# Patient Record
Sex: Male | Born: 1965 | Race: White | Hispanic: Yes | Marital: Married | Smoking: Former smoker
Health system: Southern US, Community
[De-identification: ages and names within clinical notes are randomized; demographics above are authoritative.]

## PROBLEM LIST (undated history)

## (undated) DIAGNOSIS — I10 Essential (primary) hypertension: Secondary | ICD-10-CM

## (undated) DIAGNOSIS — E7800 Pure hypercholesterolemia, unspecified: Secondary | ICD-10-CM

## (undated) DIAGNOSIS — I4891 Unspecified atrial fibrillation: Secondary | ICD-10-CM

---

## 2019-09-27 ENCOUNTER — Emergency Department (HOSPITAL_COMMUNITY)
Admission: EM | Admit: 2019-09-27 | Discharge: 2019-09-27 | Disposition: A | Discharge status: Home / Self Care | Payer: Self-pay | Attending: Emergency Medicine | Admitting: Emergency Medicine

## 2019-09-27 ENCOUNTER — Other Ambulatory Visit: Payer: Self-pay

## 2019-09-27 ENCOUNTER — Encounter (HOSPITAL_COMMUNITY): Payer: Self-pay | Admitting: Emergency Medicine

## 2019-09-27 DIAGNOSIS — I10 Essential (primary) hypertension: Secondary | ICD-10-CM | POA: Insufficient documentation

## 2019-09-27 DIAGNOSIS — D692 Other nonthrombocytopenic purpura: Secondary | ICD-10-CM

## 2019-09-27 DIAGNOSIS — R233 Spontaneous ecchymoses: Secondary | ICD-10-CM | POA: Insufficient documentation

## 2019-09-27 HISTORY — DX: Unspecified atrial fibrillation: I48.91

## 2019-09-27 HISTORY — DX: Pure hypercholesterolemia, unspecified: E78.00

## 2019-09-27 HISTORY — DX: Essential (primary) hypertension: I10

## 2019-09-27 LAB — CBC
HCT: 44.1 % (ref 39.0–52.0)
Hemoglobin: 13.9 g/dL (ref 13.0–17.0)
MCH: 29 pg (ref 26.0–34.0)
MCHC: 31.5 g/dL (ref 30.0–36.0)
MCV: 92.1 fL (ref 80.0–100.0)
Platelets: 306 10*3/uL (ref 150–400)
RBC: 4.79 MIL/uL (ref 4.22–5.81)
RDW: 13.2 % (ref 11.5–15.5)
WBC: 8.3 10*3/uL (ref 4.0–10.5)
nRBC: 0 % (ref 0.0–0.2)

## 2019-09-27 LAB — BASIC METABOLIC PANEL
Anion gap: 8 (ref 5–15)
BUN: 15 mg/dL (ref 6–20)
CO2: 29 mmol/L (ref 22–32)
Calcium: 9.2 mg/dL (ref 8.9–10.3)
Chloride: 102 mmol/L (ref 98–111)
Creatinine, Ser: 0.88 mg/dL (ref 0.61–1.24)
GFR calc Af Amer: >60 mL/min (ref >60)
GFR calc non Af Amer: >60 mL/min (ref >60)
Glucose, Bld: 105 mg/dL — ABNORMAL HIGH (ref 70–99)
Potassium: 3.9 mmol/L (ref 3.5–5.1)
Sodium: 139 mmol/L (ref 135–145)

## 2019-09-27 LAB — PROTIME-INR
INR: 1.2 (ref 0.8–1.2)
Prothrombin Time: 14.6 seconds (ref 11.4–15.2)

## 2019-09-27 NOTE — Discharge Instructions (Addendum)
The rash should go away on it's own without any treatment If it continues to get worse and your have a fever or have any worsening symptoms you should return to the ER Please establish care with a cardiologist and primary doctor in the area

## 2019-09-27 NOTE — ED Triage Notes (Signed)
Patient reports "rash" to bilateral ankles X1 day. Swelling and redness noted to bilateral ankles, no rash present - almost appears to be petechiae? Patient takes Xarelto for afib.

## 2019-09-27 NOTE — ED Provider Notes (Signed)
[facility] EMERGENCY DEPARTMENT Provider Note   CSN: [id number] Arrival date & time: 09/27/19  0021     History Chief Complaint  Patient presents with  . Leg Swelling    Paul Harris is a 54 y.o. male who presents with a rash. He states that he thinks he was bit by an insect a couple days ago. Over the past 2 days he's had a gradually worsening rash on the bilateral lower extremities. It is red but he denies any symptoms. No itching, pain, swelling. He just moved her from [place] 2 weeks ago and does not have a PCP yet. He is on Xarelto for A.fib. He denies any fever, chills, weakness, chest pain, SOB, abodminal pain, N/V. He denies tick bite.  HPI     Past Medical History:  Diagnosis Date  . Atrial fibrillation (HCC)   . High cholesterol   . Hypertension     There are no problems to display for this patient.   History reviewed. No pertinent surgical history.     No family history on file.  Social History   Tobacco Use  . Smoking status: Never Smoker  . Smokeless tobacco: Never Used  Substance Use Topics  . Alcohol use: Not Currently  . Drug use: Not Currently    Home Medications Prior to Admission medications   Not on File    Allergies    Patient has no known allergies.  Review of Systems   Review of Systems  Constitutional: Negative for chills and fever.  Respiratory: Negative for shortness of breath.   Cardiovascular: Negative for chest pain.  Gastrointestinal: Negative for abdominal pain.  Musculoskeletal: Negative for arthralgias and myalgias.  Skin: Positive for color change and rash. Negative for wound.  All other systems reviewed and are negative.   Physical Exam Updated Vital Signs BP (!) 138/99   Pulse 85   Temp 97.8 F (36.6 C) (Oral)   Resp 16   Ht 5\' 8"  (1.727 m)   Wt 104.3 kg   SpO2 99%   BMI 34.97 kg/m   Physical Exam Vitals and nursing note reviewed.  Constitutional:      General: He is not in  acute distress.    Appearance: Normal appearance. He is well-developed. He is not ill-appearing.  HENT:     Head: Normocephalic and atraumatic.  Eyes:     General: No scleral icterus.       Right eye: No discharge.        Left eye: No discharge.     Conjunctiva/sclera: Conjunctivae normal.     Pupils: Pupils are equal, round, and reactive to light.  Cardiovascular:     Rate and Rhythm: Normal rate and regular rhythm.  Pulmonary:     Effort: Pulmonary effort is normal. No respiratory distress.     Breath sounds: Normal breath sounds.  Abdominal:     General: There is no distension.  Musculoskeletal:     Cervical back: Normal range of motion.  Skin:    General: Skin is warm and dry.     Comments: Petechial rash on the LLE and purpura on the RLE  Neurological:     Mental Status: He is alert and oriented to person, place, and time.  Psychiatric:        Behavior: Behavior normal.       ED Results / Procedures / Treatments   Labs (all labs ordered are listed, but only abnormal results are displayed) Labs Reviewed  BASIC METABOLIC PANEL - Abnormal; Notable for the following components:      Result Value   Glucose, Bld 105 (*)    All other components within normal limits  CBC  PROTIME-INR    EKG None  Radiology No results found.  Procedures Procedures (including critical care time)  Medications Ordered in ED Medications - No data to display  ED Course  I have reviewed the triage vital signs and the nursing notes.  Pertinent labs & imaging results that were available during my care of the patient were reviewed by me and considered in my medical decision making (see chart for details).  54 year old male presents with a petechial and purpura rash over the past couple days which is isolated to the bilateral lower extremities. He is well appearing and denies any symptoms. Labs were drawn in triage over 12 hours ago and are normal. Unclear if this is related to his blood  thinner or not. With no systemic symptoms, normal vitals and labs, I do not feel there is any further work up warranted herein the ED. He was encouraged to establish care with a PCP and Cards for his A.fib. He was advised to return if worsening.  MDM Rules/Calculators/A&P                           Final Clinical Impression(s) / ED Diagnoses Final diagnoses:  Petechiae  Purpura [facility])    Rx / DC Orders ED Discharge Orders    None       Bethel Born, PA-C 09/28/19 1008    Vanetta Mulders, MD 10/05/19 [phone]

## 2021-05-11 ENCOUNTER — Encounter: Payer: Self-pay | Admitting: Emergency Medicine

## 2021-05-11 ENCOUNTER — Emergency Department: Payer: 59

## 2021-05-11 ENCOUNTER — Other Ambulatory Visit: Payer: Self-pay

## 2021-05-11 ENCOUNTER — Emergency Department
Admission: EM | Admit: 2021-05-11 | Discharge: 2021-05-11 | Disposition: A | Discharge status: Home / Self Care | Payer: 59 | Attending: Emergency Medicine | Admitting: Emergency Medicine

## 2021-05-11 DIAGNOSIS — Z7901 Long term (current) use of anticoagulants: Secondary | ICD-10-CM | POA: Diagnosis not present

## 2021-05-11 DIAGNOSIS — I10 Essential (primary) hypertension: Secondary | ICD-10-CM | POA: Diagnosis not present

## 2021-05-11 DIAGNOSIS — J101 Influenza due to other identified influenza virus with other respiratory manifestations: Secondary | ICD-10-CM

## 2021-05-11 DIAGNOSIS — I4891 Unspecified atrial fibrillation: Secondary | ICD-10-CM | POA: Diagnosis not present

## 2021-05-11 DIAGNOSIS — Z20822 Contact with and (suspected) exposure to covid-19: Secondary | ICD-10-CM | POA: Diagnosis not present

## 2021-05-11 DIAGNOSIS — M545 Low back pain, unspecified: Secondary | ICD-10-CM | POA: Insufficient documentation

## 2021-05-11 DIAGNOSIS — R059 Cough, unspecified: Secondary | ICD-10-CM | POA: Diagnosis present

## 2021-05-11 LAB — CBC WITH DIFFERENTIAL/PLATELET
Abs Immature Granulocytes: 0.03 10*3/uL (ref 0.00–0.07)
Basophils Absolute: 0 10*3/uL (ref 0.0–0.1)
Basophils Relative: 1 %
Eosinophils Absolute: 0 10*3/uL (ref 0.0–0.5)
Eosinophils Relative: 0 %
HCT: 47.6 % (ref 39.0–52.0)
Hemoglobin: 15.3 g/dL (ref 13.0–17.0)
Immature Granulocytes: 0 %
Lymphocytes Relative: 33 %
Lymphs Abs: 2.9 10*3/uL (ref 0.7–4.0)
MCH: 29 pg (ref 26.0–34.0)
MCHC: 32.1 g/dL (ref 30.0–36.0)
MCV: 90.2 fL (ref 80.0–100.0)
Monocytes Absolute: 1 10*3/uL (ref 0.1–1.0)
Monocytes Relative: 12 %
Neutro Abs: 4.7 10*3/uL (ref 1.7–7.7)
Neutrophils Relative %: 54 %
Platelets: 278 10*3/uL (ref 150–400)
RBC: 5.28 MIL/uL (ref 4.22–5.81)
RDW: 13.1 % (ref 11.5–15.5)
WBC: 8.7 10*3/uL (ref 4.0–10.5)
nRBC: 0 % (ref 0.0–0.2)

## 2021-05-11 LAB — COMPREHENSIVE METABOLIC PANEL
ALT: 40 U/L (ref 0–44)
AST: 47 U/L — ABNORMAL HIGH (ref 15–41)
Albumin: 4.2 g/dL (ref 3.5–5.0)
Alkaline Phosphatase: 84 U/L (ref 38–126)
Anion gap: 10 (ref 5–15)
BUN: 25 mg/dL — ABNORMAL HIGH (ref 6–20)
CO2: 26 mmol/L (ref 22–32)
Calcium: 8.6 mg/dL — ABNORMAL LOW (ref 8.9–10.3)
Chloride: 100 mmol/L (ref 98–111)
Creatinine, Ser: 0.95 mg/dL (ref 0.61–1.24)
GFR, Estimated: >60 mL/min (ref >60)
Glucose, Bld: 94 mg/dL (ref 70–99)
Potassium: 4.5 mmol/L (ref 3.5–5.1)
Sodium: 136 mmol/L (ref 135–145)
Total Bilirubin: 2 mg/dL — ABNORMAL HIGH (ref 0.3–1.2)
Total Protein: 7.6 g/dL (ref 6.5–8.1)

## 2021-05-11 LAB — RESP PANEL BY RT-PCR (FLU A&B, COVID) ARPGX2
Influenza A by PCR: POSITIVE — AB
Influenza B by PCR: NEGATIVE
SARS Coronavirus 2 by RT PCR: NEGATIVE

## 2021-05-11 LAB — DIGOXIN LEVEL: Digoxin Level: 0.3 ng/mL — ABNORMAL LOW (ref 0.8–2.0)

## 2021-05-11 LAB — TROPONIN I (HIGH SENSITIVITY): Troponin I (High Sensitivity): 10 ng/L (ref <18)

## 2021-05-11 MED ORDER — DILTIAZEM HCL 25 MG/5ML IV SOLN
10.0000 mg | Freq: Once | INTRAVENOUS | Status: AC
  Administered 2021-05-11: 10 mg via INTRAVENOUS
  Filled 2021-05-11: qty 5

## 2021-05-11 NOTE — ED Notes (Signed)
See triage note  presents with low grade temp and cough  sx's started 4 days ago ? ?

## 2021-05-11 NOTE — ED Provider Notes (Signed)
? ?[facility] ?Provider Note ? ? ? Event Date/Time  ? First MD Initiated Contact with Patient 05/11/21 1458   ?  (approximate) ? ? ?History  ? ?Cough ? ? ?HPI ? ?Paul Harris is a 56 y.o. male with history of atrial fib, hypertension and high cholesterol presents emergency department complaining of a cough for 4 days.  Patient states he is also having a lot of shortness of breath on exertion.  Thinks he may have had a fever but is not sure.  Also having some low back pain.  Back pain radiates into the left leg.  Denies any fever or chills.  States he did get sweaty when he was walking and became short of breath.  Patient takes digoxin for his atrial fibs ? ?  ? ? ?Physical Exam  ? ?Triage Vital Signs: ?ED Triage Vitals  ?Enc Vitals Group  ?   BP 05/11/21 1420 122/83  ?   Pulse Rate 05/11/21 1420 67  ?   Resp 05/11/21 1423 18  ?   Temp 05/11/21 1420 99.9 ?F (37.7 ?C)  ?   Temp Source 05/11/21 1420 Oral  ?   SpO2 05/11/21 1420 97 %  ?   Weight 05/11/21 1423 220 lb (99.8 kg)  ?   Height 05/11/21 1423 5\' 8"  (1.727 m)  ?   Head Circumference --   ?   Peak Flow --   ?   Pain Score 05/11/21 1423 0  ?   Pain Loc --   ?   Pain Edu? --   ?   Excl. in [place]? --   ? ? ?Most recent vital signs: ?Vitals:  ? 05/11/21 1420 05/11/21 1423  ?BP: 122/83   ?Pulse: 67   ?Resp:  18  ?Temp: 99.9 ?F (37.7 ?C)   ?SpO2: 97%   ? ? ? ?General: Awake, no distress.   ?CV:  Good peripheral perfusion. irregular rate and  irregular rhythm ?Resp:  Normal effort. Lungs CTA ?Abd:  No distention.   ?Other:    ? ? ?ED Results / Procedures / Treatments  ? ?Labs ?(all labs ordered are listed, but only abnormal results are displayed) ?Labs Reviewed  ?RESP PANEL BY RT-PCR (FLU A&B, COVID) ARPGX2 - Abnormal; Notable for the following components:  ?    Result Value  ? Influenza A by PCR POSITIVE (*)   ? All other components within normal limits  ?COMPREHENSIVE METABOLIC PANEL - Abnormal; Notable for the following components:  ? BUN  25 (*)   ? Calcium 8.6 (*)   ? AST 47 (*)   ? Total Bilirubin 2.0 (*)   ? All other components within normal limits  ?DIGOXIN LEVEL - Abnormal; Notable for the following components:  ? Digoxin Level 0.3 (*)   ? All other components within normal limits  ?CBC WITH DIFFERENTIAL/PLATELET  ?TROPONIN I (HIGH SENSITIVITY)  ?TROPONIN I (HIGH SENSITIVITY)  ? ? ? ?EKG ? ?EKG ? ? ?RADIOLOGY ?Chest x-ray ? ? ? ?PROCEDURES: ? ? ?Marland KitchenCritical Care E&M ?Performed by: Versie Starks, PA-C ? ?Critical care provider statement:  ?  Critical care time (minutes):  30 ?  Critical care time was exclusive of:  Separately billable procedures and treating other patients ?  Critical care was necessary to treat or prevent imminent or life-threatening deterioration of the following conditions:  Cardiac failure ?  Critical care was time spent personally by me on the following activities:  Blood draw for specimens, development  of treatment plan with patient or surrogate, evaluation of patient's response to treatment, examination of patient, obtaining history from patient or surrogate, ordering and performing treatments and interventions, ordering and review of laboratory studies, ordering and review of radiographic studies, pulse oximetry, re-evaluation of patient's condition and review of old charts ?After initial E/M assessment, critical care services were subsequently performed that were exclusive of separately billable procedures or treatment.  ? ? ? ?MEDICATIONS ORDERED IN ED: ?Medications  ?diltiazem (CARDIZEM) injection 10 mg (10 mg Intravenous Given 05/11/21 1624)  ? ? ? ?IMPRESSION / MDM / ASSESSMENT AND PLAN / ED COURSE  ?I reviewed the triage vital signs and the nursing notes. ?             ?               ? ?Differential diagnosis includes, but is not limited to, A-fib RVR, CAP, COVID, influenza, acute bronchitis ? ?EKG shows atrial fib with RVR, ventricular rate of 112, no STEMI noted, see physician read ? ?Chest x-ray independently  reviewed by me, does not show any acute pneumonia, confirmed by radiology ? ?Patient's respiratory panel is positive for influenza A ? ?CBC, comprehensive metabolic panel and troponin are basically normal. ? ?Patient's heart rate has continued to stay between 98 and 112, will give Cardizem 10 mg IV ? ?Patient responded well to the Cardizem 10 mg IV.  Heart rate is into a normal rate.  He is a 81 bpm.  Patient states he is feeling better and does not feel short of breath.  Do feel that the influenza may have initiated the rapid A-fib.  Since the patient already has medication and is anticoagulated with Xarelto feel the patient is stable for discharge.  Have tried to call cardiology for consult. ? ?Even though the patient did have a run of A-fib RVR, I do feel that his past medical history of A-fib and him being positive for influenza may have exasperated the condition.  Since he has responded well to the medication I feel there is little risk and letting him go home.  Patient is in agreement with this.  Did discuss with Dr. Chancy Milroy and Dr. Charna Archer. ? ?I did page Dr. Chancy Milroy, I sent a secure message.   ?Consult with cardiology, states to have him follow-up in his office next Tuesday at 9 AM.  Patient is aware of plans.  Is in agreement treatment plan.  He is to take his regular medications as prescribed.  Return the emergency department if worsening.  He is in agreement with treatment plan.  And discharged stable condition. ? ? ? ? ?  ? ? ?FINAL CLINICAL IMPRESSION(S) / ED DIAGNOSES  ? ?Final diagnoses:  ?Atrial fibrillation with rapid ventricular response ([place])  ?Influenza A  ? ? ? ?Rx / DC Orders  ? ?ED Discharge Orders   ? ? None  ? ?  ? ? ? ?Note:  This document was prepared using Dragon voice recognition software and may include unintentional dictation errors. ? ?  ?Versie Starks, PA-C ?05/11/21 1731 ? ?  ?Blake Divine, MD ?05/11/21 1933 ? ?

## 2021-05-11 NOTE — ED Notes (Signed)
EKG to EDP [place] in person.  

## 2021-05-11 NOTE — Discharge Instructions (Signed)
Follow-up with Dr. Chancy Milroy next Tuesday April 4 at 9 AM. ?Take your medications as prescribed ?Tylenol and ibuprofen for fever as needed ?Return to the ER if worsening ?

## 2021-05-11 NOTE — ED Triage Notes (Signed)
Pt here with a cough x4 days. Pt states he thinks that he may have the flu but but he is now having fevers, SOB, and a persistent cough. Pt stable in triage. ?

## 2021-11-07 NOTE — Progress Notes (Unsigned)
Electrophysiology Office Note:    Date:  11/08/2021   ID:  Paul Harris, DOB 21-Jun-1965, MRN [id number]  PCP:  Pcp, No  [facility] Cardiologist:  None  [facility] Electrophysiologist:  Vickie Epley, MD   Referring MD: Rubbie Battiest, MD   Chief Complaint: AF  History of Present Illness:    Paul Harris is a 56 y.o. male who presents for an evaluation of AF at the request of Dr Charna Archer. Their medical history includes pAF on xarelto, HTN, HLD, TIA in 2012..  The patient was seen in the ER in March 2023 for AF w/ RVR. He was positive for flu during that admission which was thought to be the trigger.   He tells me that he was first diagnosed with atrial fibrillation in 2009.  This was in [place].  It is unclear when his last episode of normal sinus rhythm was.  He has had care in [place] and here locally.  He is tolerating Xarelto but he tells me that he intermittently misses doses.  He is interested in pursuing cardioversion to see if he will hold onto normal rhythm and feel better given the highly symptomatic nature of his atrial fibrillation when he tries to exert himself.  He tells me it is very possible for him to take his blood thinner every day leading up to the cardioversion procedure.  Today he tells me that he snores loudly and has probable apneic episodes at night according to the patient's wife.  He has never been tested for sleep apnea.      Past Medical History:  Diagnosis Date   Atrial fibrillation ([place])    High cholesterol    Hypertension     No past surgical history on file.  Current Medications: Current Meds  Medication Sig   atorvastatin (LIPITOR) 20 MG tablet Take by mouth.   carvedilol (COREG) 12.5 MG tablet Take 12.5 mg by mouth 2 (two) times daily with a meal.   digoxin (LANOXIN) 0.25 MG tablet Take 0.25 mg by mouth daily.   diltiazem (CARDIZEM CD) 180 MG 24 hr capsule Take 180 mg by mouth daily.   rivaroxaban (XARELTO) 20 MG  TABS tablet Take 20 mg by mouth daily.     Allergies:   Patient has no known allergies.   Social History   Socioeconomic History   Marital status: Married    Spouse name: Not on file   Number of children: Not on file   Years of education: Not on file   Highest education level: Not on file  Occupational History   Not on file  Tobacco Use   Smoking status: Never   Smokeless tobacco: Never  Substance and Sexual Activity   Alcohol use: Not Currently   Drug use: Not Currently   Sexual activity: Not on file  Other Topics Concern   Not on file  Social History Narrative   Not on file   Social Determinants of Health   Financial Resource Strain: Not on file  Food Insecurity: Not on file  Transportation Needs: Not on file  Physical Activity: Not on file  Stress: Not on file  Social Connections: Not on file     Family History: The patient's family history is not on file.  ROS:   Please see the history of present illness.    All other systems reviewed and are negative.  EKGs/Labs/Other Studies Reviewed:    The following studies were reviewed today:   EKG:  The ekg  ordered today demonstrates atrial fibrillation with a ventricular rate of 93 bpm.   Recent Labs: 05/11/2021: ALT 40; BUN 25; Creatinine, Ser 0.95; Hemoglobin 15.3; Platelets 278; Potassium 4.5; Sodium 136  Recent Lipid Panel No results found for: "CHOL", "TRIG", "HDL", "CHOLHDL", "VLDL", "LDLCALC", "LDLDIRECT"  Physical Exam:    VS:  BP 112/72 (BP Location: Left Arm, Patient Position: Sitting, Cuff Size: Large)   Pulse 93   Ht 5\' 8"  (1.727 m)   Wt 239 lb 6.4 oz (108.6 kg)   SpO2 97%   BMI 36.40 kg/m     Wt Readings from Last 3 Encounters:  11/08/21 239 lb 6.4 oz (108.6 kg)  05/11/21 220 lb (99.8 kg)  09/27/19 230 lb (104.3 kg)     GEN:  Well nourished, well developed in no acute distress.  Obese HEENT: Normal NECK: No JVD; No carotid bruits LYMPHATICS: No lymphadenopathy CARDIAC: Irregularly  irregular, no murmurs, rubs, gallops RESPIRATORY:  Clear to auscultation without rales, wheezing or rhonchi  ABDOMEN: Soft, non-tender, non-distended MUSCULOSKELETAL:  No edema; No deformity  SKIN: Warm and dry NEUROLOGIC:  Alert and oriented x 3 PSYCHIATRIC:  Normal affect       ASSESSMENT:    1. Paroxysmal atrial fibrillation (HCC)   2. Primary hypertension    PLAN:    In order of problems listed above:  #Persistent atrial fibrillation Unclear when his last episode of sinus rhythm was.  I do think it is worth a trial of cardioversion to see if he will maintain normal rhythm given the highly symptomatic nature of his atrial fibrillation when he exerts himself.  We will schedule this for 4 weeks from now to ensure that he has 4 solid weeks to take his blood thinner daily.  I stressed the importance of daily anticoagulation without a single missed dose between now and the cardioversion procedure and for at least 1 month after the cardioversion procedure.  I discussed the risks of cardioversion in detail with the patient and he wishes to proceed.  I will get an echocardiogram as well given the diagnosis of atrial fibrillation.  #Likely sleep disordered breathing Loud snoring with apneic episodes.  Plan for home sleep study.    Medication Adjustments/Labs and Tests Ordered: Current medicines are reviewed at length with the patient today.  Concerns regarding medicines are outlined above.  Orders Placed This Encounter  Procedures   EKG 12-Lead   No orders of the defined types were placed in this encounter.    Signed, 09/29/19. Rossie Muskrat, MD, [facility], [facility] 11/08/2021 8:39 AM    Electrophysiology [facility]

## 2021-11-08 ENCOUNTER — Ambulatory Visit: Payer: BC Managed Care – PPO | Attending: Cardiology | Admitting: Cardiology

## 2021-11-08 ENCOUNTER — Encounter: Payer: Self-pay | Admitting: Cardiology

## 2021-11-08 VITALS — BP 112/72 | HR 93 | Ht 68.0 in | Wt 239.4 lb

## 2021-11-08 DIAGNOSIS — I10 Essential (primary) hypertension: Secondary | ICD-10-CM

## 2021-11-08 DIAGNOSIS — R5383 Other fatigue: Secondary | ICD-10-CM

## 2021-11-08 DIAGNOSIS — I480 Paroxysmal atrial fibrillation: Secondary | ICD-10-CM

## 2021-11-08 MED ORDER — RIVAROXABAN 20 MG PO TABS: 20.0000 mg | ORAL_TABLET | Freq: Every day | ORAL | 3 refills | Status: DC

## 2021-11-08 NOTE — Patient Instructions (Addendum)
Medication Instructions:  none *If you need a refill on your cardiac medications before your next appointment, please call your pharmacy*   Lab Work: none If you have labs (blood work) drawn today and your tests are completely normal, you will receive your results only by: [place] (if you have MyChart) OR A paper copy in the mail If you have any lab test that is abnormal or we need to change your treatment, we will call you to review the results.   Testing/Procedures: Your physician has requested that you have an echocardiogram. Echocardiography is a painless test that uses sound waves to create images of your heart. It provides your doctor with information about the size and shape of your heart and how well your heart's chambers and valves are working. This procedure takes approximately one hour. There are no restrictions for this procedure.  Your physician has recommended that you have a sleep study. This test records several body functions during sleep, including: brain activity, eye movement, oxygen and carbon dioxide blood levels, heart rate and rhythm, breathing rate and rhythm, the flow of air through your mouth and nose, snoring, body muscle movements, and chest and belly movement.   Follow-Up: At [facility], you and your health needs are our priority.  As part of our continuing mission to provide you with exceptional heart care, we have created designated Provider Care Teams.  These Care Teams include your primary Cardiologist (physician) and Advanced Practice Providers (APPs -  Physician Assistants and Nurse Practitioners) who all work together to provide you with the care you need, when you need it.  We recommend signing up for the patient portal called "MyChart".  Sign up information is provided on this After Visit Summary.  MyChart is used to connect with patients for Virtual Visits (Telemedicine).  Patients are able to view lab/test results, encounter notes,  upcoming appointments, etc.  Non-urgent messages can be sent to your provider as well.   To learn more about what you can do with MyChart, go to [email or web address].    Your next appointment:   12 week(s)  The format for your next appointment:   In Person  Provider:   You will see one of the following Advanced Practice Providers on your designated Care Team:   Murray Hodgkins, NP Christell Faith, PA-C Cadence Kathlen Mody, PA-C Gerrie Nordmann, NP     You are scheduled for a Cardioversion on Oct 25 with Dr.Agbor-Etang Please arrive at the [place] [facility] at 6  a.m. on the day of your procedure.  DIET INSTRUCTIONS:  Nothing to eat or drink after midnight except your medications with a              sip of water.         Labs: morning of Cardioversion   Medications:  YOU MAY TAKE ALL of your morning medications with a small amount of water.  Must have a responsible person to drive you home.  Bring a current list of your medications and current insurance cards.    If you have any questions after you get home, please call the office at [place]

## 2021-11-10 ENCOUNTER — Other Ambulatory Visit: Payer: Self-pay | Admitting: Student

## 2021-11-10 ENCOUNTER — Ambulatory Visit
Admission: RE | Admit: 2021-11-10 | Discharge: 2021-11-10 | Disposition: A | Discharge status: Home / Self Care | Payer: BC Managed Care – PPO | Attending: Student | Admitting: Student

## 2021-11-10 ENCOUNTER — Ambulatory Visit
Admission: RE | Admit: 2021-11-10 | Discharge: 2021-11-10 | Disposition: A | Discharge status: Home / Self Care | Payer: BC Managed Care – PPO | Source: Ambulatory Visit | Attending: Student | Admitting: Student

## 2021-11-10 DIAGNOSIS — M1710 Unilateral primary osteoarthritis, unspecified knee: Secondary | ICD-10-CM | POA: Diagnosis present

## 2021-11-13 ENCOUNTER — Telehealth: Payer: Self-pay | Admitting: *Deleted

## 2021-11-13 NOTE — Telephone Encounter (Signed)
Prior Authorization for [facility] sent to [facility] via Phone. NO PA REQ--Reference # [id number].  Patient is on spouses insurance policy but can not be found. The insurance reports he is inactive and his birthday is incorrect in their system. Patient disagrees and he will talk to his wife and have her call to have his information corrected.

## 2021-11-15 NOTE — Telephone Encounter (Signed)
This is a Public relations account executive pt. Itamar set up in [place] office.

## 2021-11-15 NOTE — Telephone Encounter (Signed)
Advised okay to completed sleep study and gave access code.  Patient verbalized understanding.

## 2021-11-17 NOTE — Telephone Encounter (Signed)
  Pt said, he tried to start itamar sleep study and put in access code [phone] but it didn't work

## 2021-11-17 NOTE — Telephone Encounter (Signed)
Spoke with the patient, device was initialized however pin was put in as 1230. Patient is aware of new pin number.

## 2021-11-17 NOTE — Telephone Encounter (Signed)
Patient's device has not been initialized in [employer]. In order to initialize we will need the serial number on the box. He is not currently at home where the box is located. He asked that I call him back around 1pm this afternoon and he will be able to provide that for me.

## 2021-11-18 ENCOUNTER — Encounter (HOSPITAL_BASED_OUTPATIENT_CLINIC_OR_DEPARTMENT_OTHER): Payer: Self-pay | Admitting: Cardiology

## 2021-11-18 DIAGNOSIS — G4733 Obstructive sleep apnea (adult) (pediatric): Secondary | ICD-10-CM

## 2021-11-20 ENCOUNTER — Ambulatory Visit: Payer: Self-pay | Attending: Cardiology

## 2021-11-20 DIAGNOSIS — R5383 Other fatigue: Secondary | ICD-10-CM

## 2021-11-20 DIAGNOSIS — I480 Paroxysmal atrial fibrillation: Secondary | ICD-10-CM

## 2021-11-20 DIAGNOSIS — I10 Essential (primary) hypertension: Secondary | ICD-10-CM

## 2021-11-20 NOTE — Procedures (Signed)
SLEEP STUDY REPORT Patient Information Study Date: 11/18/21 Patient Name: Paul Harris Patient ID: [id number] Birth Date: 2065-10-27 Age: 56 Gender: Male BMI: 36.4 (W=240 lb, H=5' 8'') Referring Physician: Ignacia Bayley, NP  TEST DESCRIPTION: Home sleep apnea testing was completed using the WatchPat, a Type 1 device, utilizing peripheral arterial tonometry (PAT), chest movement, actigraphy, pulse oximetry, pulse rate, body position and snore.  AHI was calculated with apnea and hypopnea using valid sleep time as the denominator. RDI includes apneas, hypopneas, and RERAs.  The data acquired and the scoring of sleep and all associated events were performed in accordance with the recommended standards and specifications as outlined in the AASM Manual for the Scoring of Sleep and Associated Events 2.2.0 (2015).  FINDINGS:  1.  Severe Obstructive Sleep Apnea with AHI 47.7/hr.   2.  Moderate  Central Sleep Apnea with pAHIc 19.6/hr with 31.8% of that time in [facility].  3.  Oxygen desaturations as low as 79%.  4.  Moderate snoring was present. O2 sats were < 88% for 18.4 min.  5.  Total sleep time was 6 hrs and 17 min.  6.  17.3% of total sleep time was spent in REM sleep.   7.  Normal sleep onset latency at 17 min.   8.  Shortened REM sleep onset latency at 39 min.   9.  Total awakenings were 5.  10. Arrhythmia detection:  Suggestive of possible brief atrial fibrillation lasting 6 hr 41min and 23secs.  This is not diagnostic and further testing with outpatient telemetry monitoring is recommended.  DIAGNOSIS:   Severe Obstructive Sleep Apnea (G47.33) Moderate Central Sleep Apnea Nocturnal Hypoxemia Possible Atrial Fibrillation  RECOMMENDATIONS:   1.  Clinical correlation of these findings is necessary.  The decision to treat obstructive sleep apnea (OSA) is usually based on the presence of apnea symptoms or the presence of associated medical conditions such as  Hypertension, Congestive Heart Failure, Atrial Fibrillation or Obesity.  The most common symptoms of OSA are snoring, gasping for breath while sleeping, daytime sleepiness and fatigue.   2.  Initiating apnea therapy is recommended given the presence of symptoms and/or associated conditions. Recommend proceeding with one of the following:     a.  Auto-CPAP therapy with a pressure range of 5-20cm H2O.     b.  An oral appliance (OA) that can be obtained from certain dentists with expertise in sleep medicine.  These are primarily of use in non-obese patients with mild and moderate disease.     c.  An ENT consultation which may be useful to look for specific causes of obstruction and possible treatment options.     d.  If patient is intolerant to PAP therapy, consider referral to ENT for evaluation for hypoglossal nerve stimulator.   3.  Close follow-up is necessary to ensure success with CPAP or oral appliance therapy for maximum benefit.  4.  A follow-up oximetry study on CPAP is recommended to assess the adequacy of therapy and determine the need for supplemental oxygen or the potential need for Bi-level therapy.  An arterial blood gas to determine the adequacy of baseline ventilation and oxygenation should also be considered.  5.  Healthy sleep recommendations include:  adequate nightly sleep (normal 7-9 hrs/night), avoidance of caffeine after noon and alcohol near bedtime, and maintaining a sleep environment that is cool, dark and quiet.  6.  Weight loss for overweight patients is recommended.  Even modest amounts of weight  loss can significantly improve the severity of sleep apnea.  7.  Snoring recommendations include:  weight loss where appropriate, side sleeping, and avoidance of alcohol before bed.  8.  Operation of motor vehicle should be avoided when sleepy.  9.  If no history of atrial fibrillation or flutter then consider outpatient heart monitor.  Signature:   Armanda Magic, MD;  [facility]; Diplomat, American Board of Sleep Medicine Electronically Signed: 11/20/21 Page 2 of 5

## 2021-11-21 ENCOUNTER — Telehealth: Payer: Self-pay | Admitting: *Deleted

## 2021-11-21 NOTE — Telephone Encounter (Signed)
-----   Message from Lauralee Evener, [place] sent at 11/20/2021  2:33 PM EDT -----  ----- Message ----- From: Sueanne Margarita, MD Sent: 11/20/2021  11:59 AM EDT To: Cv Div Sleep Studies  Please let patient know that they have sleep apnea.  Recommend therapeutic CPAP titration ASAP for treatment of patient's sleep disordered breathing.  If unable to perform an in lab titration then initiate ResMed auto CPAP from 4 to 15cm H2O with heated humidity and mask of choice and overnight pulse ox on CPAP.

## 2021-11-21 NOTE — Telephone Encounter (Signed)
The patient has been notified of the result. Left detailed message on voicemail and informed patient to call back..Armon Orvis Green, CMA   

## 2021-11-28 ENCOUNTER — Other Ambulatory Visit: Payer: Self-pay | Admitting: Cardiology

## 2021-11-28 DIAGNOSIS — I480 Paroxysmal atrial fibrillation: Secondary | ICD-10-CM

## 2021-11-29 ENCOUNTER — Telehealth: Payer: Self-pay | Admitting: Cardiovascular Disease

## 2021-11-29 NOTE — Telephone Encounter (Signed)
Patient called and would like to cancel his surgery that is scheduled on 12/06/21

## 2021-11-29 NOTE — Telephone Encounter (Signed)
The patient can not afford the cardioversion stating his insurance said it would be over $1000 for this. While talking with him and his son patient states he is feeling good and doesn't think his heart is out of rhythm. Will continue as planned with appointment in December per MD.  Verbalized understanding and agreement.

## 2021-11-29 NOTE — Telephone Encounter (Signed)
Left voicemail with Harris scheduling to cancel the patient DCCV.

## 2021-12-06 ENCOUNTER — Encounter: Admission: RE | Payer: Self-pay | Source: Home / Self Care

## 2021-12-06 ENCOUNTER — Ambulatory Visit
Admission: RE | Admit: 2021-12-06 | Payer: BC Managed Care – PPO | Source: Home / Self Care | Admitting: Cardiovascular Disease

## 2021-12-06 DIAGNOSIS — Z01818 Encounter for other preprocedural examination: Secondary | ICD-10-CM

## 2021-12-06 DIAGNOSIS — I4891 Unspecified atrial fibrillation: Secondary | ICD-10-CM

## 2021-12-06 SURGERY — CARDIOVERSION
Anesthesia: General

## 2021-12-08 ENCOUNTER — Other Ambulatory Visit: Payer: BC Managed Care – PPO

## 2022-01-03 NOTE — Telephone Encounter (Signed)
The patient has been notified of the result. Left detailed message on voicemail and informed patient to call back..Jayvin Hurrell Green, CMA   

## 2022-01-31 ENCOUNTER — Ambulatory Visit: Payer: Self-pay | Attending: Cardiology | Admitting: Cardiology

## 2022-01-31 ENCOUNTER — Ambulatory Visit: Payer: BC Managed Care – PPO | Admitting: Cardiology

## 2022-01-31 ENCOUNTER — Encounter: Payer: Self-pay | Admitting: Cardiology

## 2022-01-31 NOTE — Progress Notes (Deleted)
Electrophysiology Office Follow up Visit Note:    Date:  01/31/2022   ID:  Paul Harris, DOB 28-Mar-1965, MRN [id number]  PCP:  Pcp, No  [facility] Cardiologist:  None  [facility] Electrophysiologist:  Lanier Prude, MD    Interval History:    Paul Harris is a 56 y.o. male who presents for a follow up visit. They were last seen in clinic November 08, 2021.  At the last appointment I started him on blood thinner and plan for cardioversion after 4 weeks of consistent blood thinner use.  An echo was also ordered.  He has not gotten his cardioversion or had his echo done.       Past Medical History:  Diagnosis Date   Atrial fibrillation (HCC)    High cholesterol    Hypertension     No past surgical history on file.  Current Medications: No outpatient medications have been marked as taking for the 01/31/22 encounter (Appointment) with Lanier Prude, MD.     Allergies:   Patient has no known allergies.   Social History   Socioeconomic History   Marital status: Married    Spouse name: Not on file   Number of children: Not on file   Years of education: Not on file   Highest education level: Not on file  Occupational History   Not on file  Tobacco Use   Smoking status: Former    Types: Cigarettes   Smokeless tobacco: Never   Tobacco comments:    1 or 2 per day former   Substance and Sexual Activity   Alcohol use: Not Currently   Drug use: Not Currently   Sexual activity: Not on file  Other Topics Concern   Not on file  Social History Narrative   Not on file   Social Determinants of Health   Financial Resource Strain: Not on file  Food Insecurity: Not on file  Transportation Needs: Not on file  Physical Activity: Not on file  Stress: Not on file  Social Connections: Not on file     Family History: The patient's family history is not on file.  ROS:   Please see the history of present illness.    All other systems  reviewed and are negative.  EKGs/Labs/Other Studies Reviewed:    The following studies were reviewed today: ***  EKG:  The ekg ordered today demonstrates ***  Recent Labs: 05/11/2021: ALT 40; BUN 25; Creatinine, Ser 0.95; Hemoglobin 15.3; Platelets 278; Potassium 4.5; Sodium 136  Recent Lipid Panel No results found for: "CHOL", "TRIG", "HDL", "CHOLHDL", "VLDL", "LDLCALC", "LDLDIRECT"  Physical Exam:    VS:  There were no vitals taken for this visit.    Wt Readings from Last 3 Encounters:  11/08/21 239 lb 6.4 oz (108.6 kg)  05/11/21 220 lb (99.8 kg)  09/27/19 230 lb (104.3 kg)     GEN: *** Well nourished, well developed in no acute distress HEENT: Normal NECK: No JVD; No carotid bruits LYMPHATICS: No lymphadenopathy CARDIAC: ***RRR, no murmurs, rubs, gallops RESPIRATORY:  Clear to auscultation without rales, wheezing or rhonchi  ABDOMEN: Soft, non-tender, non-distended MUSCULOSKELETAL:  No edema; No deformity  SKIN: Warm and dry NEUROLOGIC:  Alert and oriented x 3 PSYCHIATRIC:  Normal affect        ASSESSMENT:    No diagnosis found. PLAN:    In order of problems listed above:           Total time spent with patient  today *** minutes. This includes reviewing records, evaluating the patient and coordinating care.   Medication Adjustments/Labs and Tests Ordered: Current medicines are reviewed at length with the patient today.  Concerns regarding medicines are outlined above.  No orders of the defined types were placed in this encounter.  No orders of the defined types were placed in this encounter.    Signed, Steffanie Dunn, MD, [facility], [facility] 01/31/2022 1:40 PM    Electrophysiology [facility]

## 2022-09-12 ENCOUNTER — Telehealth: Payer: Self-pay | Admitting: Cardiology

## 2022-09-12 NOTE — Telephone Encounter (Signed)
Left message on voice mail to schedule overdue fu Message sent from PCP Cr[place]

## 2022-09-20 ENCOUNTER — Ambulatory Visit
Admission: EM | Admit: 2022-09-20 | Discharge: 2022-09-20 | Disposition: A | Discharge status: Home / Self Care | Payer: BLUE CROSS/BLUE SHIELD

## 2022-09-20 DIAGNOSIS — K625 Hemorrhage of anus and rectum: Secondary | ICD-10-CM

## 2022-09-20 NOTE — ED Notes (Signed)
Patient is being discharged from the Urgent Care and sent to the Emergency Department via Personal Vehicle . Per Paul Harris, [place], patient is in need of higher level of care due to Possible GI Bleed. Patient is aware and verbalizes understanding of plan of care.  Vitals:   09/20/22 1528  BP: (!) 146/115  Pulse: (!) 104  SpO2: 98%

## 2022-09-20 NOTE — ED Provider Notes (Signed)
57 year old male with history of atrial fibrillation on long-term anticoagulation with Xarelto, hypertension hyperlipidemia presents for 2-day history of excessive amounts of bright red blood and rectal pain.  Reports hard stools.  Patient says the blood is bright red.  It fills the toilet bowl.  Denies any bleeding from other sites.  Denies any history of diverticular reticulitis, colon cancer, diverticulosis, hemorrhoids.  Reports history of GI bleed.  BP 146/115.  Pulse elevated at 104.  He is overall well-appearing.  Advised patient I am concerned for GI bleed at this time.  Explained that it could be due to hemorrhoids and I can certainly take a look but if he does not have hemorrhoids he will be referred to the emergency department for further work up of GI bleed.  Patient states he does not want to stay in the urgent care anyway since he just found out we are out of network for him.  He plans to proceed to [facility] emergency department in [place].  Going by private vehicle.  Leaving in stable condition.   Shirlee Latch, PA-C 09/20/22 1550

## 2022-09-20 NOTE — Discharge Instructions (Addendum)
-  You are describing a gastrointestinal bleed.  We discussed different causes which could include hemorrhoids.  I offered to take a look at the area to see if you have hemorrhoids but since we are out of network for you you have decided to go to the emergency department since you may need to go there anyway to have further workup. - Please proceed to [facility] emergency department in [place] or [place].

## 2022-09-20 NOTE — ED Triage Notes (Signed)
Pt c/o bright red blood in stool x2days  Pt states that the symptoms started on 09/19/22 at 9pm  Pt states that the water in the toilet bowl is red from the blood.  Pt states that when he wipes he feels burning.   Pt has a history of hemorrhoids   Pt states that he has had blood in stool around April/may of this year but he did not have insurance so he did not have it evaluated.   Pt denies any fever, dizziness, or nausea.   Pt states that he had hard, small stools and it was not complete.   Pt denies diverticulitis or abdominal issues.

## 2022-10-12 ENCOUNTER — Ambulatory Visit: Payer: BLUE CROSS/BLUE SHIELD | Admitting: Cardiology

## 2023-08-12 ENCOUNTER — Other Ambulatory Visit: Payer: Self-pay | Admitting: Family Medicine

## 2023-08-12 DIAGNOSIS — R7989 Other specified abnormal findings of blood chemistry: Secondary | ICD-10-CM

## 2023-08-21 ENCOUNTER — Ambulatory Visit
Admission: RE | Admit: 2023-08-21 | Discharge: 2023-08-21 | Disposition: A | Discharge status: Home / Self Care | Source: Ambulatory Visit | Attending: Family Medicine | Admitting: Family Medicine

## 2023-08-21 DIAGNOSIS — R7989 Other specified abnormal findings of blood chemistry: Secondary | ICD-10-CM | POA: Diagnosis present

## 2023-10-03 IMAGING — CR DG CHEST 2V
2 series · 2 of 2 positions shown · non-contrast
Comparison: None.

CLINICAL DATA: Cough, shortness of breath.

EXAM:
CHEST - 2 VIEW

[chest pa]
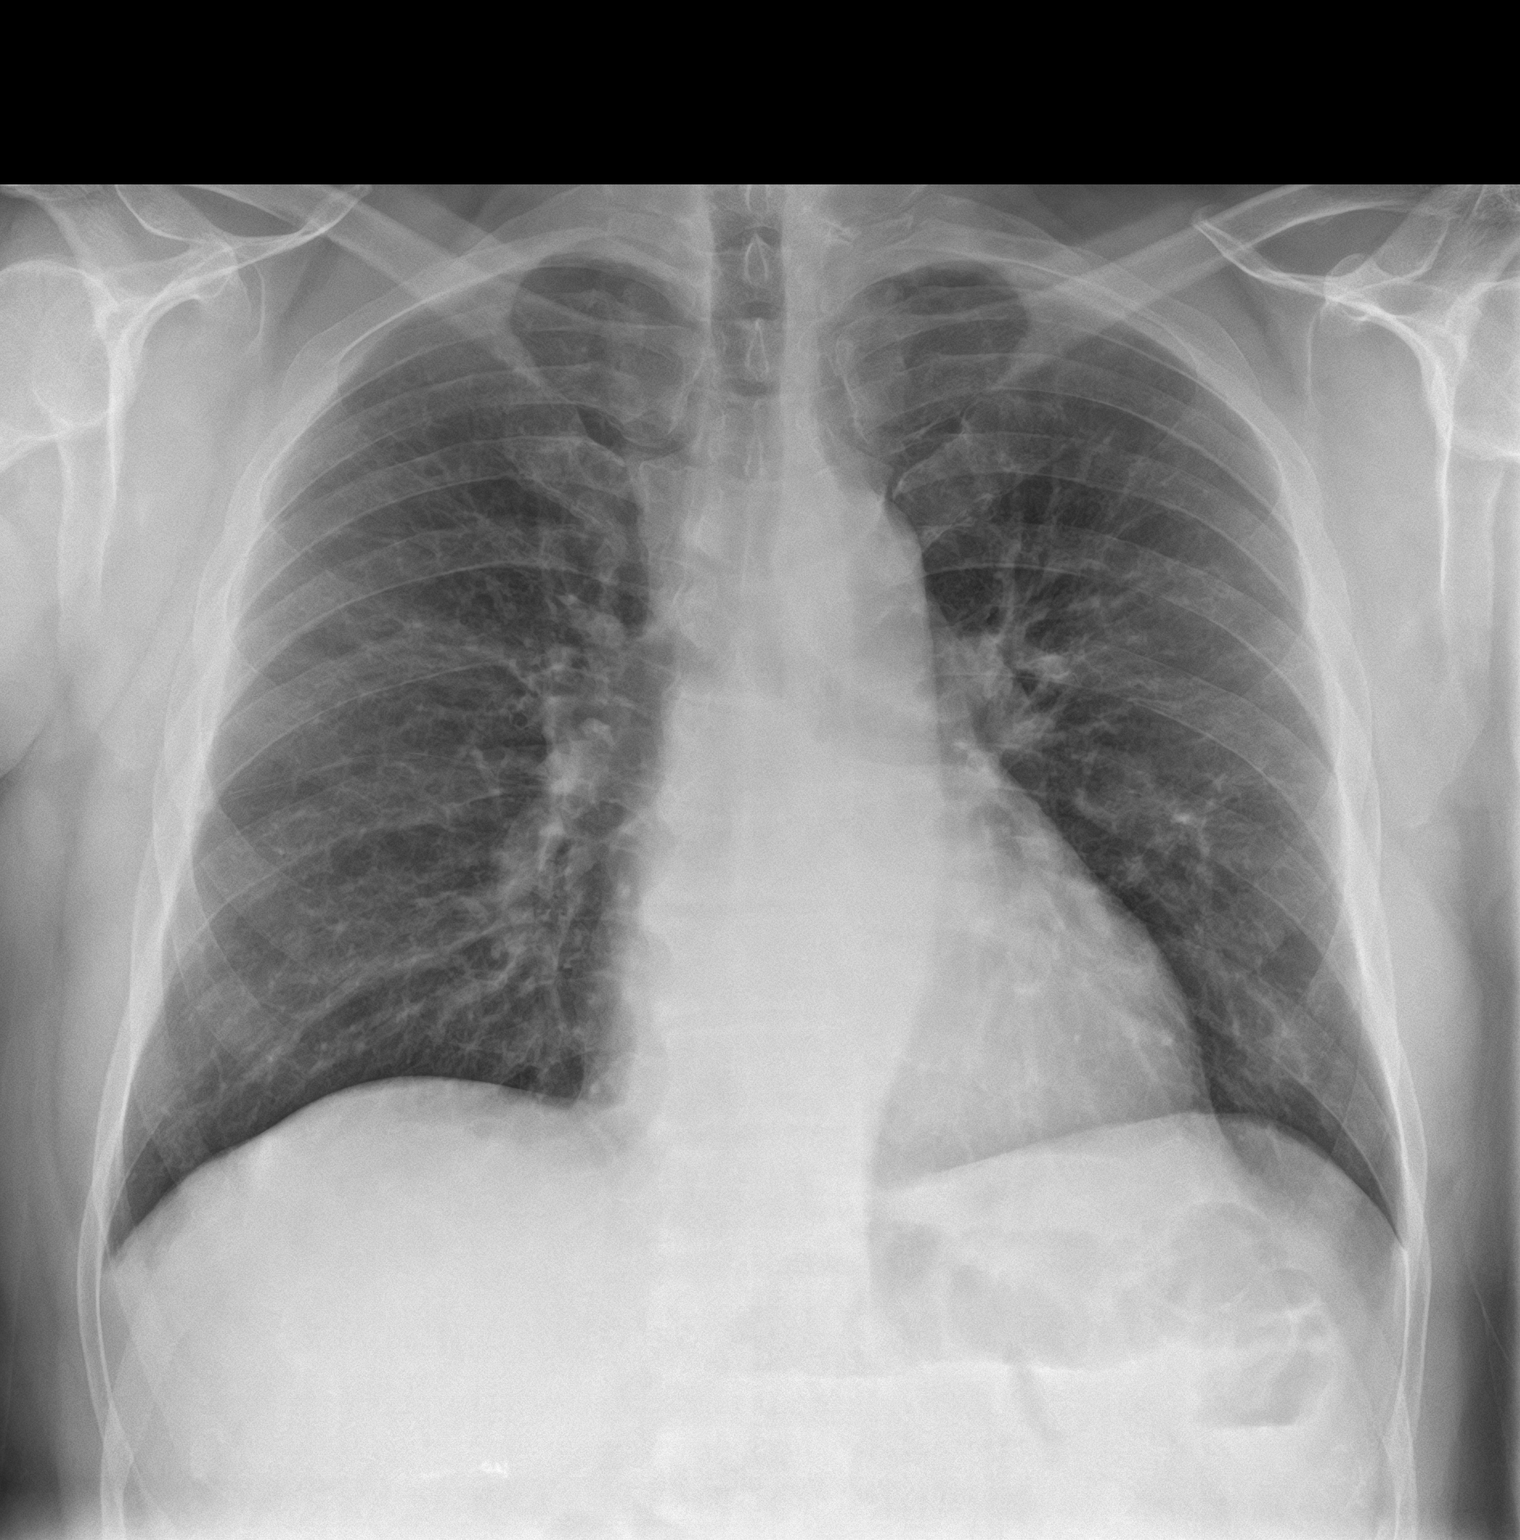

[chest lat]
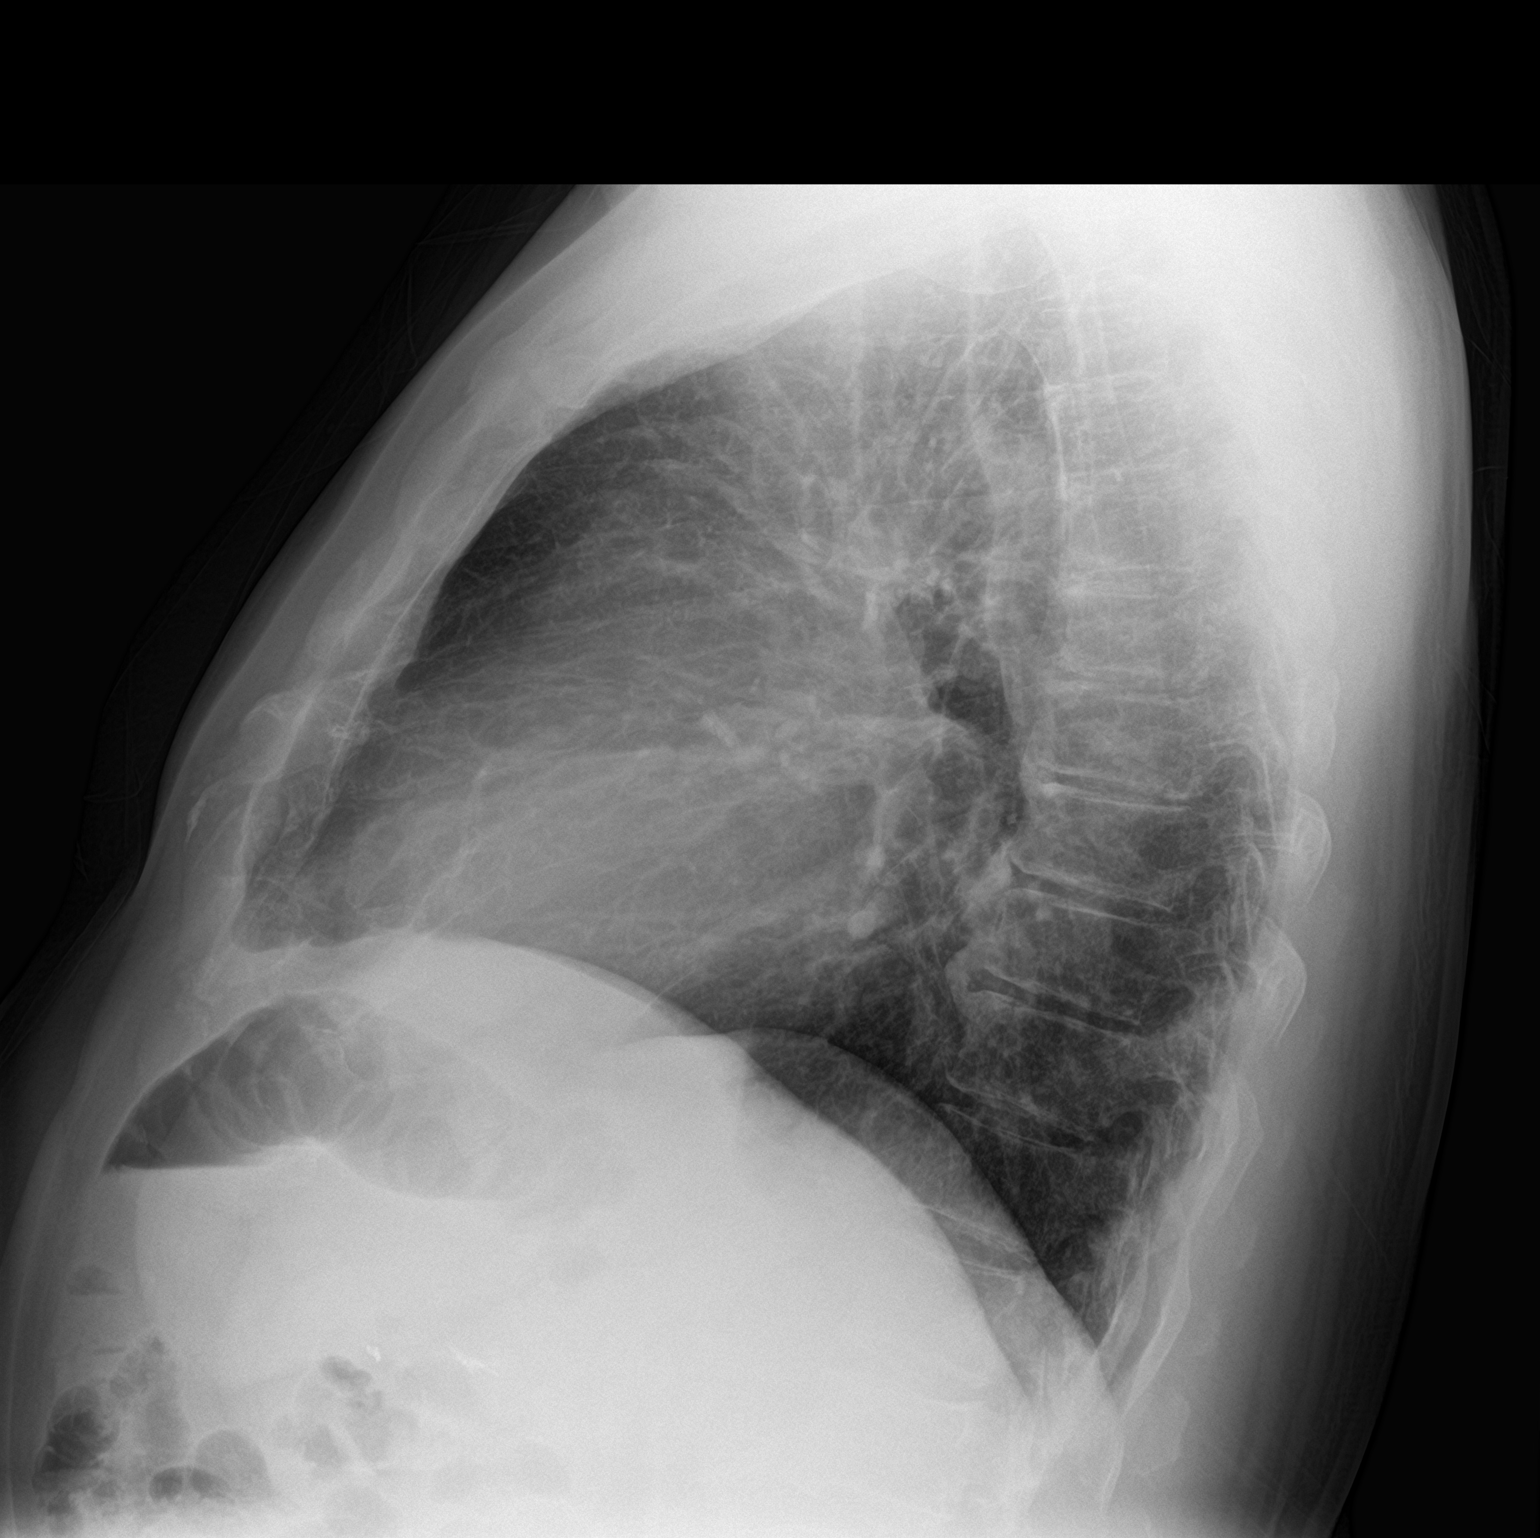

[2 of 2 positions shown; findings below may reference images not displayed]

FINDINGS: The heart size and mediastinal contours are within normal limits.
Both lungs are clear. The visualized skeletal structures are
unremarkable.
IMPRESSION: No active cardiopulmonary disease.

## 2024-01-21 ENCOUNTER — Ambulatory Visit: Admitting: Family Medicine

## 2024-01-24 ENCOUNTER — Encounter: Payer: Self-pay | Admitting: Family Medicine

## 2024-01-24 ENCOUNTER — Ambulatory Visit: Admitting: Family Medicine

## 2024-01-24 VITALS — BP 128/85 | HR 54 | Temp 97.5°F | Ht 68.0 in | Wt 230.4 lb

## 2024-01-24 DIAGNOSIS — Z23 Encounter for immunization: Secondary | ICD-10-CM | POA: Diagnosis not present

## 2024-01-24 DIAGNOSIS — E782 Mixed hyperlipidemia: Secondary | ICD-10-CM | POA: Diagnosis not present

## 2024-01-24 DIAGNOSIS — I4891 Unspecified atrial fibrillation: Secondary | ICD-10-CM | POA: Diagnosis not present

## 2024-01-24 DIAGNOSIS — I10 Essential (primary) hypertension: Secondary | ICD-10-CM | POA: Diagnosis not present

## 2024-01-24 DIAGNOSIS — G8929 Other chronic pain: Secondary | ICD-10-CM

## 2024-01-24 DIAGNOSIS — I509 Heart failure, unspecified: Secondary | ICD-10-CM | POA: Diagnosis not present

## 2024-01-24 DIAGNOSIS — M5416 Radiculopathy, lumbar region: Secondary | ICD-10-CM | POA: Insufficient documentation

## 2024-01-24 DIAGNOSIS — M1710 Unilateral primary osteoarthritis, unspecified knee: Secondary | ICD-10-CM | POA: Diagnosis not present

## 2024-01-24 DIAGNOSIS — M25562 Pain in left knee: Secondary | ICD-10-CM | POA: Diagnosis not present

## 2024-01-24 DIAGNOSIS — M546 Pain in thoracic spine: Secondary | ICD-10-CM | POA: Insufficient documentation

## 2024-01-24 MED ORDER — DILTIAZEM HCL ER COATED BEADS 180 MG PO CP24: 180.0000 mg | ORAL_CAPSULE | Freq: Every day | ORAL | 1 refills | Status: AC

## 2024-01-24 MED ORDER — CARVEDILOL 12.5 MG PO TABS: 12.5000 mg | ORAL_TABLET | Freq: Two times a day (BID) | ORAL | 1 refills | Status: DC

## 2024-01-24 MED ORDER — RIVAROXABAN 20 MG PO TABS: 20.0000 mg | ORAL_TABLET | Freq: Every day | ORAL | 3 refills | Status: AC

## 2024-01-24 MED ORDER — ATORVASTATIN CALCIUM 40 MG PO TABS: 40.0000 mg | ORAL_TABLET | Freq: Every day | ORAL | 1 refills | Status: AC

## 2024-01-24 MED ORDER — DIGOXIN 250 MCG PO TABS: 0.2500 mg | ORAL_TABLET | Freq: Every day | ORAL | 1 refills | Status: AC

## 2024-01-24 NOTE — Assessment & Plan Note (Signed)
 Has pain from the middle of his back through his hip into the knee.  The pain is lateral on his thigh.  Will get x-rays of the lumbar spine.  Will look at referral to pain management soon.

## 2024-01-24 NOTE — Assessment & Plan Note (Signed)
 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  Has had 1 steroid injection and then a plasma injection.  Refer to orthopedist for potential hyaluronic acid injection

## 2024-01-24 NOTE — Assessment & Plan Note (Signed)
 Has chronic thoracic pain.  Will check thoracic x-rays.  Encouraged him to take Tylenol 500 mg 2 p.o. twice a day.  Please avoid NSAIDs because you are on Xarelto 

## 2024-01-24 NOTE — Assessment & Plan Note (Signed)
 He is on carvedilol 12.5 mg daily and diltiazem  CD1 80 mg daily.  Blood pressure is well-controlled.  Rate is well-controlled

## 2024-01-24 NOTE — Progress Notes (Signed)
 New Patient Office Visit  Subjective    Patient ID: Paul Harris, male    DOB: 1965-10-26  Age: 58 y.o. MRN: [id number]  CC:  Chief Complaint  Patient presents with   Establish Care   Medication Refill    HPI Paul Harris presents to establish care Discussed the use of AI scribe software for clinical note transcription with the patient, who gave verbal consent to proceed.  History of Present Illness   Paul Harris is a 58 year old male with atrial fibrillation and hypertension who presents for medication management and knee pain. He is accompanied by his wife.  He is seeking management for his medications, which include atorvastatin 40 mg, Coreg 12.5 mg, digoxin  0.125 mg, Cardizem   CD 180 mg, and Xarelto  20 mg. He occasionally takes Aleve for knee pain.  He has experienced left knee pain for over a year. He received two injections in [place], which provided some relief. He got a steroid injection and then a plasma injection.  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 He has not had surgery on his knee but has had x-rays in [place], and was told by the doctor there that he has arthrosis.  He had a severe COVID-19 infection requiring hospitalization and subsequent pneumonia, necessitating oxygen therapy  post-discharge.   He felt dizzy for a few seconds while cooking dinner one evening.  He denied concurrent chest pain, shortness of breath, diaphoresis or syncopal feeling.   No chest pain or leg swelling. He can lie flat in bed without difficulty.  He describes a warm sensation in his back and hip pain radiating down his thigh, which he associates with sciatica. This has been present for over a year, starting after his knee pain. He has not had x-rays of his back but has had hip x-rays in [place].      Outpatient Encounter Medications as of 01/24/2024  Medication Sig    [DISCONTINUED] atorvastatin (LIPITOR) 40 MG tablet Take 1 tablet every day by oral route.   atorvastatin (LIPITOR) 40 MG tablet Take 1 tablet (40 mg total) by mouth daily.   carvedilol (COREG) 12.5 MG tablet Take 1 tablet (12.5 mg total) by mouth 2 (two) times daily with a meal.   digoxin  (LANOXIN ) 0.25 MG tablet Take 1 tablet (0.25 mg total) by mouth daily.   diltiazem  (CARDIZEM  CD) 180 MG 24 hr capsule Take 1 capsule (180 mg total) by mouth daily.   rivaroxaban  (XARELTO ) 20 MG TABS tablet Take 1 tablet (20 mg total) by mouth daily.   [DISCONTINUED] atorvastatin (LIPITOR) 20 MG tablet Take by mouth.   [DISCONTINUED] carvedilol (COREG) 12.5 MG tablet Take 12.5 mg by mouth 2 (two) times daily with a meal.   [DISCONTINUED] digoxin  (LANOXIN ) 0.25 MG tablet Take 0.25 mg by mouth daily.   [DISCONTINUED] diltiazem  (CARDIZEM  CD) 180 MG 24 hr capsule Take 180 mg by mouth daily.   [DISCONTINUED] rivaroxaban  (XARELTO ) 20 MG TABS tablet Take 1 tablet (20 mg total) by mouth daily.   No facility-administered encounter medications on file as of 01/24/2024.    Past Medical History:  Diagnosis Date   Atrial fibrillation (HCC)    High cholesterol    Hypertension     No past surgical history on file.  No family history on file.  Social History   Socioeconomic History   Marital status: Married  Spouse name: Not on file   Number of children: Not on file   Years of education: Not on file   Highest education level: Not on file  Occupational History   Not on file  Tobacco Use   Smoking status: Former    Types: Cigarettes   Smokeless tobacco: Never   Tobacco comments:    1 or 2 per day former   Substance and Sexual Activity   Alcohol use: Not Currently   Drug use: Not Currently   Sexual activity: Not on file  Other Topics Concern   Not on file  Social History Narrative   Not on file   Social Drivers of Health   Tobacco Use: Medium Risk (01/24/2024)   Patient History    Smoking  Tobacco Use: Former    Smokeless Tobacco Use: Never    Passive Exposure: Not on Actuary Strain: Not on file  Food Insecurity: Not on file  Transportation Needs: Not on file  Physical Activity: Not on file  Stress: Not on file  Social Connections: Unknown (06/25/2021)   [facility]   Social Network    Social Network: Not on file  Intimate Partner Violence: Unknown (05/17/2021)   Received from [facility]   HITS    Physically Hurt: Not on file    Insult or Talk Down To: Not on file    Threaten Physical Harm: Not on file    Scream or Curse: Not on file  Depression (PHQ2-9): Not on file  Alcohol Screen: Not on file  Housing: Not on file  Utilities: Not on file  Health Literacy: Not on file    ROS      Objective   BP 128/85   Pulse (!) 54   Temp (!) 97.5 F (36.4 C) (Oral)   Ht 5' 8 (1.727 m)   Wt 230 lb 6 oz (104.5 kg)   SpO2 97%   BMI 35.03 kg/m    Physical Exam Vitals and nursing note reviewed.  Constitutional:      Appearance: Normal appearance.  HENT:     Head: Normocephalic and atraumatic.  Eyes:     Conjunctiva/sclera: Conjunctivae normal.  Cardiovascular:     Rate and Rhythm: Normal rate and regular rhythm.  Pulmonary:     Effort: Pulmonary effort is normal.     Breath sounds: Normal breath sounds.  Musculoskeletal:     Right lower leg: No edema.     Left lower leg: No edema.  Skin:    General: Skin is warm and dry.  Neurological:     Mental Status: He is alert and oriented to person, place, and time.  Psychiatric:        Mood and Affect: Mood normal.        Behavior: Behavior normal.        Thought Content: Thought content normal.        Judgment: Judgment normal.            The 10-year ASCVD risk score (Arnett DK, et al., 2019) is: 18.8%     Assessment & Plan:  Atrial fibrillation, unspecified type [facility]) Assessment & Plan: Checked an EKG today and he is in atrial fibrillation with a rate of 61.   He is on Cardizem  for rate control and Xarelto  for blood thinner.  He also takes digoxin  0.25 mg daily.  Will check digoxin  level.  Also checking CBC since he is on a blood thinner. He has been  taking Aleve for his knee pain ask him to switch to acetaminophen 500 mg to twice a day instead  Orders: -     dilTIAZem  HCl ER Coated Beads; Take 1 capsule (180 mg total) by mouth daily.  Dispense: 90 capsule; Refill: 1 -     Rivaroxaban ; Take 1 tablet (20 mg total) by mouth daily.  Dispense: 90 tablet; Refill: 3 -     Digoxin  level -     EKG 12-Lead  Chronic congestive heart failure, unspecified heart failure type (HCC) -     Digoxin ; Take 1 tablet (0.25 mg total) by mouth daily.  Dispense: 90 tablet; Refill: 1 -     CMP14+EGFR  Primary hypertension Assessment & Plan: He is on carvedilol 12.5 mg daily and diltiazem  CD1 80 mg daily.  Blood pressure is well-controlled.  Rate is well-controlled  Orders: -     Carvedilol; Take 1 tablet (12.5 mg total) by mouth 2 (two) times daily with a meal.  Dispense: 90 tablet; Refill: 1 -     CBC with Differential/Platelet; Future -     TSH + free T4  Mixed hyperlipidemia -     Atorvastatin Calcium; Take 1 tablet (40 mg total) by mouth daily.  Dispense: 90 tablet; Refill: 1 -     Lipid panel  Need for pneumococcal 20-valent conjugate vaccination -     Pneumococcal conjugate vaccine 20-valent  Lumbar radiculopathy Assessment & Plan: Has pain from the middle of his back through his hip into the knee.  The pain is lateral on his thigh.  Will get x-rays of the lumbar spine.  Will look at referral to pain management soon.  Orders: -     DG Lumbar Spine 2-3 Views; Future  Chronic midline thoracic back pain -     DG Thoracic Spine 2 View; Future  Chronic pain of left knee -     Ambulatory referral to Orthopedic Surgery  Thoracic spine pain Assessment & Plan: Has chronic thoracic pain.  Will check thoracic x-rays.  Encouraged him to take Tylenol 500 mg 2  p.o. twice a day.  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  Has had 1 steroid injection and then a plasma injection.  Refer to orthopedist for potential hyaluronic acid injection     Return in about 3 months (around 04/23/2024).   Raymel Cull K Charlita Brian, MD

## 2024-01-24 NOTE — Assessment & Plan Note (Signed)
 Checked an EKG today and he is in atrial fibrillation with a rate of 61.  He is on Cardizem  for rate control and Xarelto  for blood thinner.  He also takes digoxin  0.25 mg daily.  Will check digoxin  level.  Also checking CBC since he is on a blood thinner. He has been taking Aleve for his knee pain ask him to switch to acetaminophen 500 mg to twice a day instead

## 2024-01-28 LAB — LIPID PANEL
Chol/HDL Ratio: 3.4 ratio (ref 0.0–5.0)
Cholesterol, Total: 210 mg/dL — ABNORMAL HIGH (ref 100–199)
HDL: 62 mg/dL (ref >39)
LDL Chol Calc (NIH): 132 mg/dL — ABNORMAL HIGH (ref 0–99)
Triglycerides: 89 mg/dL (ref 0–149)
VLDL Cholesterol Cal: 16 mg/dL (ref 5–40)

## 2024-01-28 LAB — CMP14+EGFR
ALT: 25 IU/L (ref 0–44)
AST: 26 IU/L (ref 0–40)
Albumin: 4.4 g/dL (ref 3.8–4.9)
Alkaline Phosphatase: 138 IU/L — ABNORMAL HIGH (ref 47–123)
BUN/Creatinine Ratio: 21 (ref 10–24)
BUN: 16 mg/dL (ref 8–27)
Bilirubin Total: 1.5 mg/dL — ABNORMAL HIGH (ref 0.0–1.2)
CO2: 21 mmol/L (ref 20–29)
Calcium: 9.5 mg/dL (ref 8.6–10.2)
Chloride: 101 mmol/L (ref 96–106)
Creatinine, Ser: 0.75 mg/dL — ABNORMAL LOW (ref 0.76–1.27)
Globulin, Total: 2.8 g/dL (ref 1.5–4.5)
Glucose: 101 mg/dL — ABNORMAL HIGH (ref 70–99)
Potassium: 4.7 mmol/L (ref 3.5–5.2)
Sodium: 140 mmol/L (ref 134–144)
Total Protein: 7.2 g/dL (ref 6.0–8.5)
eGFR: 103 mL/min/1.73 (ref >59)

## 2024-01-28 LAB — TSH+FREE T4
Free T4: 1.18 ng/dL (ref 0.82–1.77)
TSH: 3.21 u[IU]/mL (ref 0.450–4.500)

## 2024-01-28 LAB — DIGOXIN LEVEL: Digoxin, Serum: 0.7 ng/mL (ref 0.5–0.9)

## 2024-02-04 ENCOUNTER — Ambulatory Visit: Payer: Self-pay | Admitting: Family Medicine

## 2024-02-18 ENCOUNTER — Other Ambulatory Visit: Payer: Self-pay | Admitting: Family Medicine

## 2024-02-18 DIAGNOSIS — I10 Essential (primary) hypertension: Secondary | ICD-10-CM

## 2024-04-23 ENCOUNTER — Ambulatory Visit: Admitting: Family Medicine
